# Patient Record
Sex: Female | Born: 2000 | Hispanic: Yes | Marital: Married | Smoking: Former smoker
Health system: Southern US, Community
[De-identification: ages and names within clinical notes are randomized; demographics above are authoritative.]

## PROBLEM LIST (undated history)

## (undated) DIAGNOSIS — Z789 Other specified health status: Secondary | ICD-10-CM

## (undated) HISTORY — DX: Other specified health status: Z78.9

## (undated) HISTORY — PX: WISDOM TOOTH EXTRACTION: SHX21

---
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
## 2021-02-09 ENCOUNTER — Ambulatory Visit: Payer: Medicaid Other | Admitting: Advanced Practice Midwife

## 2021-02-09 ENCOUNTER — Other Ambulatory Visit: Payer: Self-pay

## 2021-02-09 ENCOUNTER — Encounter: Payer: Self-pay | Admitting: Advanced Practice Midwife

## 2021-02-09 ENCOUNTER — Other Ambulatory Visit (HOSPITAL_COMMUNITY)
Admission: RE | Admit: 2021-02-09 | Discharge: 2021-02-09 | Disposition: A | Discharge status: Home / Self Care | Payer: Medicaid Other | Source: Ambulatory Visit | Attending: Advanced Practice Midwife | Admitting: Advanced Practice Midwife

## 2021-02-09 ENCOUNTER — Ambulatory Visit (INDEPENDENT_AMBULATORY_CARE_PROVIDER_SITE_OTHER): Payer: Medicaid Other | Admitting: Advanced Practice Midwife

## 2021-02-09 VITALS — BP 90/60 | Ht 62.0 in | Wt 108.0 lb

## 2021-02-09 DIAGNOSIS — Z113 Encounter for screening for infections with a predominantly sexual mode of transmission: Secondary | ICD-10-CM | POA: Diagnosis not present

## 2021-02-11 NOTE — Progress Notes (Signed)
   Patient ID: Secret Felicia [name], female   DOB: June 30, 2001, 20 y.o.   MRN: [id number]  Reason for Consult: STD testing    Subjective:  Date of Service: 02/09/2021  HPI:  Felicia [name] is a 20 y.o. female being seen for test of cure for Chlamydia treatment 2 months ago. She also mentions a new partner since treatment has Chlamydia. She does not use condoms. Reiterated advice to patient to use condoms to protect against STDs to protect her pelvic health. She denies any vaginal or urinary symptoms at this time.  Past Medical History:  Diagnosis Date  . No pertinent past medical history    History reviewed. No pertinent family history. Past Surgical History:  Procedure Laterality Date  . WISDOM TOOTH EXTRACTION      Short Social History:  Social History   Tobacco Use  . Smoking status: Current Some Day Smoker  . Smokeless tobacco: Never Used  Substance Use Topics  . Alcohol use: Not Currently    No Known Allergies  Current Outpatient Medications  Medication Sig Dispense Refill  . ibuprofen (ADVIL) 600 MG tablet Take 600 mg by mouth every 6 (six) hours.    Marland Kitchen oxyCODONE-acetaminophen (PERCOCET/ROXICET) 5-325 MG tablet Take 1 tablet by mouth every 4 (four) hours as needed.     No current facility-administered medications for this visit.    Review of Systems  Constitutional: Negative for chills and fever.  HENT: Negative for congestion, ear discharge, ear pain, hearing loss, sinus pain and sore throat.   Eyes: Negative for blurred vision and double vision.  Respiratory: Negative for cough, shortness of breath and wheezing.   Cardiovascular: Negative for chest pain, palpitations and leg swelling.  Gastrointestinal: Negative for abdominal pain, blood in stool, constipation, diarrhea, heartburn, melena, nausea and vomiting.  Genitourinary: Negative for dysuria, flank pain, frequency, hematuria and urgency.  Musculoskeletal: Negative for back pain, joint pain and myalgias.  Skin:  Negative for itching and rash.  Neurological: Negative for dizziness, tingling, tremors, sensory change, speech change, focal weakness, seizures, loss of consciousness, weakness and headaches.  Endo/Heme/Allergies: Negative for environmental allergies. Does not bruise/bleed easily.  Psychiatric/Behavioral: Negative for depression, hallucinations, memory loss, substance abuse and suicidal ideas. The patient is not nervous/anxious and does not have insomnia.        Objective:  Objective   Vitals:   02/09/21 1447  BP: 90/60  Weight: 108 lb (49 kg)  Height: 5\' 2"  (1.575 m)   Body mass index is 19.75 kg/m. Constitutional: Well nourished, well developed female in no acute distress.  HEENT: normal Skin: Warm and dry.  Cardiovascular: Regular rate and rhythm.   Extremity: no edema Respiratory: Clear to auscultation bilateral. Normal respiratory effort Neuro: DTRs 2+, Cranial nerves grossly intact Psych: Alert and Oriented x3. No memory deficits. Normal mood and affect.  MS: normal gait, normal bilateral lower extremity ROM/strength/stability.  Pelvic exam:  is not limited by body habitus EGBUS: within normal limits Vagina: within normal limits and with normal mucosa     Assessment/Plan:     20 y.o. G0 P0000 female STD TOC/testing  Aptima: vaginitis/STDs Follow up as needed after lab results   12 CNM [facility] 02/11/2021, 2:36 PM

## 2021-02-13 ENCOUNTER — Other Ambulatory Visit: Payer: Self-pay | Admitting: Advanced Practice Midwife

## 2021-02-13 DIAGNOSIS — A749 Chlamydial infection, unspecified: Secondary | ICD-10-CM

## 2021-02-13 DIAGNOSIS — B9689 Other specified bacterial agents as the cause of diseases classified elsewhere: Secondary | ICD-10-CM

## 2021-02-13 LAB — CERVICOVAGINAL ANCILLARY ONLY
Bacterial Vaginitis (gardnerella): POSITIVE — AB
Candida Glabrata: NEGATIVE
Candida Vaginitis: NEGATIVE
Chlamydia: POSITIVE — AB
Comment: NEGATIVE
Comment: NEGATIVE
Comment: NEGATIVE
Comment: NEGATIVE
Comment: NEGATIVE
Comment: NORMAL
Neisseria Gonorrhea: NEGATIVE
Trichomonas: NEGATIVE

## 2021-02-13 MED ORDER — AZITHROMYCIN 500 MG PO TABS: 1000.0000 mg | ORAL_TABLET | Freq: Once | ORAL | 1 refills | Status: AC

## 2021-02-13 MED ORDER — METRONIDAZOLE 500 MG PO TABS: 500.0000 mg | ORAL_TABLET | Freq: Two times a day (BID) | ORAL | 0 refills | Status: AC

## 2021-02-13 NOTE — Progress Notes (Signed)
Rx azithromycin to treat chlamydia Rx metronidazole to treat bv Spoke with patient and reviewed lab results/Rx's sent Communicable dx reporting done

## 2021-03-14 DIAGNOSIS — N946 Dysmenorrhea, unspecified: Secondary | ICD-10-CM | POA: Diagnosis not present

## 2021-03-14 DIAGNOSIS — Z7251 High risk heterosexual behavior: Secondary | ICD-10-CM | POA: Diagnosis not present

## 2021-03-14 DIAGNOSIS — Z113 Encounter for screening for infections with a predominantly sexual mode of transmission: Secondary | ICD-10-CM | POA: Diagnosis not present

## 2021-03-19 DIAGNOSIS — Z0000 Encounter for general adult medical examination without abnormal findings: Secondary | ICD-10-CM | POA: Diagnosis not present

## 2021-05-07 DIAGNOSIS — Z7251 High risk heterosexual behavior: Secondary | ICD-10-CM | POA: Diagnosis not present

## 2021-05-07 DIAGNOSIS — A749 Chlamydial infection, unspecified: Secondary | ICD-10-CM | POA: Diagnosis not present

## 2021-10-04 ENCOUNTER — Ambulatory Visit: Payer: Medicaid Other | Admitting: Licensed Practical Nurse

## 2021-10-12 ENCOUNTER — Ambulatory Visit (INDEPENDENT_AMBULATORY_CARE_PROVIDER_SITE_OTHER): Payer: Medicaid Other | Admitting: Licensed Practical Nurse

## 2021-10-12 ENCOUNTER — Other Ambulatory Visit: Payer: Self-pay

## 2021-10-12 ENCOUNTER — Encounter: Payer: Self-pay | Admitting: Licensed Practical Nurse

## 2021-10-12 VITALS — BP 98/62 | Ht 62.0 in | Wt 113.0 lb

## 2021-10-12 DIAGNOSIS — Z113 Encounter for screening for infections with a predominantly sexual mode of transmission: Secondary | ICD-10-CM | POA: Diagnosis not present

## 2021-10-12 DIAGNOSIS — N898 Other specified noninflammatory disorders of vagina: Secondary | ICD-10-CM | POA: Diagnosis not present

## 2021-10-12 DIAGNOSIS — N926 Irregular menstruation, unspecified: Secondary | ICD-10-CM

## 2021-10-12 LAB — POCT URINE PREGNANCY: Preg Test, Ur: NEGATIVE

## 2021-10-12 MED ORDER — AZITHROMYCIN 500 MG PO TABS: 1000.0000 mg | ORAL_TABLET | Freq: Once | ORAL | 0 refills | Status: AC

## 2021-10-12 NOTE — Progress Notes (Signed)
°  Gynecology STD Evaluation   Chief Complaint:  Chief Complaint  Patient presents with   Exposure to STD    STD testing + exposure to chlamydia. RM 4    History of Present Illness: Patient is a 21 y.o. G0P0000 presents for STD testing.  The patient has not noted intermenstrual spotting,  has not experienced postcoital bleeding, and does not report increased vaginal discharge.  But has experienced a foul odor. There is a history of prior sexually transmitted infection(s).     The patient is sexually active and reports unsure lifetime sexual partners . Has had 1 partner in the last 3 months, has had a new partner since last screening for HIV/RPR/HepB/C. She currently uses None for contraception. the patient rarely uses condoms to prevent the spread of sexually transmitted infections.  She patient has been previously transfused or tattooed.    Reports her partner was recently tested and treated for Chlamydia.  Pt asked about  having her tubes tied, discussed at her age and having not had children most providers would be resistant to this procedure.  Strongly encouraged considering LARC given her strong desire to not have children. Reviewed each method and pamphlets give.   LMP 09/07/2021/ Reports her cycles are "irregular" in that they may skip 1 to 2 months, and the flow is sometimes heavy but sometimes light.   PMHx: She  has a past medical history of No pertinent past medical history. Also,  has a past surgical history that includes Wisdom tooth extraction., family history is not on file.,  reports that she has been smoking. She has never used smokeless tobacco. She reports that she does not currently use alcohol. She reports current drug use. Drug: Marijuana.  She has a current medication list which includes the following prescription(s): azithromycin. Also, has No Known Allergies.  Review of Systems  Constitutional:  Negative for chills, fever and weight loss.  Gastrointestinal:  Negative  for abdominal pain.  Genitourinary:  Positive for dysuria. X 1 day [place] vaginal discharge  Objective: BP 98/62    Ht 5\' 2"  (1.575 m)    Wt 113 lb (51.3 kg)    LMP 09/07/2021    BMI 20.67 kg/m  Physical Exam Genitourinary:     Vulva normal.     Genitourinary Comments: Think white-green discharge present Cervix pink with ectrophy, no lesions   Pulmonary:     Effort: Pulmonary effort is normal.  Neurological:     Mental Status: She is alert.   Pregnancy test negative   Assessment: 21 y.o. G0P0000 STD screening and contraceptive counseling   Plan: Problem List Items Addressed This Visit   None    1) Contraception - Education given regarding options for contraception, including barrier methods, injectable contraception, IUD placement, oral contraceptives.   2) STI screening was offered and accepted  Script for Azithromycin sent to pharmacy on file   3) STD prevention, use and side effects of OCPs, and family planning choices  4) Will call and schedule apt once decided on Nexplanon or IUD   12, CNM  Carie Caddy, [facility]  10/12/21  11:38 AM

## 2021-10-16 ENCOUNTER — Other Ambulatory Visit: Payer: Self-pay | Admitting: Licensed Practical Nurse

## 2021-10-16 DIAGNOSIS — B9689 Other specified bacterial agents as the cause of diseases classified elsewhere: Secondary | ICD-10-CM

## 2021-10-16 DIAGNOSIS — N760 Acute vaginitis: Secondary | ICD-10-CM

## 2021-10-16 LAB — NUSWAB VAGINITIS (VG)
Atopobium vaginae: HIGH Score — AB
BVAB 2: HIGH Score — AB
Candida albicans, NAA: NEGATIVE
Candida glabrata, NAA: NEGATIVE
Trich vag by NAA: NEGATIVE

## 2021-10-16 MED ORDER — METRONIDAZOLE 500 MG PO TABS: 500.0000 mg | ORAL_TABLET | Freq: Two times a day (BID) | ORAL | 0 refills | Status: DC

## 2021-10-16 NOTE — Progress Notes (Signed)
TC to [place] Your swabs show that you have BV, reviewed nature of BV and recommend treatment, script has been sent your pharmacy. Also, gc/ct and were tested with the sample I sent to the lab, I have added those tests to current sample at [facility].  We should know results in 2 days. Lakera confirmed she has started treatment for presumed chlamydia, will go today or tomorrow to get Flagyl. She does not consume alcohol. Asissted in setting up mychart Will send message if testing is negative, otherwise I will call with abnormal results.  Carie Caddy, CNM  [facility], [facility]  10/16/21  3:05 PM

## 2021-10-17 LAB — HIV-1/HIV-2 QUALITATIVE RNA
Final Interpretation: NEGATIVE
HIV-1 RNA, Qualitative: NONREACTIVE
HIV-2 RNA, Qualitative: NONREACTIVE

## 2021-10-17 LAB — HIV 1/2 AB DIFFERENTIATION
HIV 1 Ab: NONREACTIVE
HIV 2 Ab: NONREACTIVE
NOTE (HIV CONF MULTIP: NEGATIVE

## 2021-10-17 LAB — HEP, RPR, HIV PANEL
HIV Screen 4th Generation wRfx: REACTIVE
Hepatitis B Surface Ag: NEGATIVE
RPR Ser Ql: NONREACTIVE

## 2021-10-17 LAB — HEPATITIS C ANTIBODY: Hep C Virus Ab: 0.1 s/co ratio (ref 0.0–0.9)

## 2021-10-18 ENCOUNTER — Telehealth: Payer: Self-pay | Admitting: Licensed Practical Nurse

## 2021-10-18 NOTE — Telephone Encounter (Signed)
TC Jaylynn called saying she may have been exposed to chlamydia again, she had IC with her partner today.  He is going to see his provider this week. Ashby did take her treatment on Monday, sounds like her partner has not yet been treated. (He told her he was exposed/has chlamydia). Reviewed her gc/ct test has not resulted yet, it was added on to the vaganitis swab collected last week.  Rec returning in 3-4 weeks for repeat screen/ to check for reinfection. Reviewed labs results from last visit, carefully explained HIV results, You are HIV negative.  Questions answered regarding contraception, she is leaning towards the Nexplanon, plans to come in 3-4 weeks for both gc/ct testing and contraception. She did ask if she can be on 2 forms of BC, reviewed you can only be on one hormone, but you can and should use condoms in addition to your other contraceptive choice. Now has access to [employer], CNM  [facility], [facility]  @TODAY @  4:28 PM

## 2021-10-18 NOTE — Telephone Encounter (Signed)
Pt called in and would like for you to give her a call when you get a chance.  It is in reference to her test results.

## 2021-10-19 ENCOUNTER — Encounter: Payer: Self-pay | Admitting: Licensed Practical Nurse

## 2021-10-19 LAB — GC/CHLAMYDIA PROBE AMP
Chlamydia trachomatis, NAA: POSITIVE — AB
Neisseria Gonorrhoeae by PCR: NEGATIVE

## 2021-10-19 LAB — SPECIMEN STATUS REPORT

## 2021-11-12 ENCOUNTER — Telehealth: Payer: Self-pay | Admitting: Obstetrics and Gynecology

## 2021-11-12 ENCOUNTER — Ambulatory Visit (INDEPENDENT_AMBULATORY_CARE_PROVIDER_SITE_OTHER): Payer: Medicaid Other | Admitting: Licensed Practical Nurse

## 2021-11-12 ENCOUNTER — Encounter: Payer: Self-pay | Admitting: Licensed Practical Nurse

## 2021-11-12 ENCOUNTER — Other Ambulatory Visit: Payer: Self-pay

## 2021-11-12 VITALS — BP 122/70 | Ht 62.0 in | Wt 118.0 lb

## 2021-11-12 DIAGNOSIS — Z3009 Encounter for other general counseling and advice on contraception: Secondary | ICD-10-CM | POA: Diagnosis not present

## 2021-11-12 DIAGNOSIS — Z113 Encounter for screening for infections with a predominantly sexual mode of transmission: Secondary | ICD-10-CM

## 2021-11-12 NOTE — Progress Notes (Signed)
°  Gynecology STD Evaluation   Chief Complaint:  Chief Complaint  Patient presents with   Exposure to STD    TOC from chlamydia    Contraception    History of Present Illness: Patient is a 21 y.o. G0P0000 presents for STD testing. Was treated for Chlamydia 10/18/21, was re-exposed during that time.  Reports her partner told her has been treated.  She continues to be sexually active with this partner.  Now uses condoms every time.   Desires Nexplanon for contraception, reviewed most common side effects and insertion process . Understands that her bleeding profile will most likely change.  Shown model of device.   PMHx: She  has a past medical history of No pertinent past medical history. Also,  has a past surgical history that includes Wisdom tooth extraction., family history is not on file.,  reports that she has been smoking. She has never used smokeless tobacco. She reports that she does not currently use alcohol. She reports current drug use. Drug: Marijuana.  She has a current medication list which includes the following prescription(s): metronidazole. Also, has No Known Allergies.  Review of Systems  Constitutional: Negative.   Respiratory: Negative.    Cardiovascular: Negative.   Gastrointestinal: Negative.   Genitourinary: Negative.   Endo/Heme/Allergies: Negative.    Objective: BP 122/70    Ht 5\' 2"  (1.575 m)    Wt 118 lb (53.5 kg)    LMP 11/05/2021 (Exact Date)    BMI 21.58 kg/m  Physical Exam Genitourinary:     Vulva normal.  Musculoskeletal:        General: Normal range of motion.  Neurological:     Mental Status: She is alert.  Skin:    General: Skin is warm.   Here for test of cure only, swan collected by this CNM as pt was already undressed,   Assessment: 21 y.o. G0P0000 No problem-specific Assessment & Plan notes found for this encounter.   Plan: Problem List Items Addressed This Visit   None Visit Diagnoses     Screen for sexually transmitted diseases     -  Primary   Relevant Orders   GC/Chlamydia Probe Amp        1) Contraception - Education given regarding options for contraception, including barrier methods.  Will return for Nexplanon    2) STI screening was offered swab only collected  12, CNM  Carie Caddy, [facility]  11/12/21  5:40 PM

## 2021-11-12 NOTE — Telephone Encounter (Signed)
Pt is scheduled for Nexplanon placement on 2/8 with [facility].

## 2021-11-13 DIAGNOSIS — Z113 Encounter for screening for infections with a predominantly sexual mode of transmission: Secondary | ICD-10-CM | POA: Diagnosis not present

## 2021-11-14 ENCOUNTER — Other Ambulatory Visit: Payer: Self-pay

## 2021-11-14 ENCOUNTER — Encounter: Payer: Self-pay | Admitting: Obstetrics and Gynecology

## 2021-11-14 ENCOUNTER — Ambulatory Visit (INDEPENDENT_AMBULATORY_CARE_PROVIDER_SITE_OTHER): Payer: Medicaid Other | Admitting: Obstetrics and Gynecology

## 2021-11-14 VITALS — BP 90/60 | Ht 62.0 in | Wt 113.0 lb

## 2021-11-14 DIAGNOSIS — Z30017 Encounter for initial prescription of implantable subdermal contraceptive: Secondary | ICD-10-CM

## 2021-11-14 LAB — POCT URINE PREGNANCY: Preg Test, Ur: NEGATIVE

## 2021-11-14 MED ORDER — ETONOGESTREL 68 MG ~~LOC~~ IMPL: 1.0000 | DRUG_IMPLANT | Freq: Once | SUBCUTANEOUS | 0 refills | Status: DC

## 2021-11-14 NOTE — Patient Instructions (Signed)
I value your feedback and you entrusting us with your care. If you get a [facility] patient survey, I would appreciate you taking the time to let us know about your experience today. Thank you!  Remove the dressing in 24 hours,  keep the incision area dry for 24 hours and remove the Steristrip in 2-3  days.  Notify us if any signs of tenderness, redness, pain, or fevers develop.   

## 2021-11-14 NOTE — Telephone Encounter (Signed)
Noted. Nexplanon reserved for this patient. 

## 2021-11-14 NOTE — Progress Notes (Signed)
° °  Chief Complaint  Patient presents with   Contraception    Nexplanon insertion     HPI:  Felicia [name] is a 21 y.o. G0P0000 here for Nexplanon insertion for [facility], changing from condoms. Awaiting chlamydia TOC form 11/12/21 with Carie Caddy.    BP 90/60    Ht 5\' 2"  (1.575 m)    Wt 113 lb (51.3 kg)    LMP 11/05/2021 (Exact Date)    BMI 20.67 kg/m    Nexplanon Insertion  Patient given informed consent, signed copy in the chart, time out was performed. Pregnancy test was neg Appropriate time out taken.  Patient's LEFT arm was prepped and draped in the usual sterile fashion. The ruler used to measure and mark insertion area.  Pt was prepped with betadine swab and then injected with 1.0 cc of 2% lidocaine with epinephrine. Nexplanon removed form packaging,  Device confirmed in needle, then inserted full length of needle and withdrawn per handbook instructions.  Pt insertion site covered with steri-strip and a bandage.   Minimal blood loss.  Pt tolerated the procedure welL.  Results for orders placed or performed in visit on 11/14/21 (from the past 24 hour(s))  POCT urine pregnancy     Status: Normal   Collection Time: 11/14/21 10:13 AM  Result Value Ref Range   Preg Test, Ur Negative Negative    Assessment: Nexplanon insertion - Plan: POCT urine pregnancy, etonogestrel (NEXPLANON) 68 MG IMPL implant   Meds ordered this encounter  Medications   etonogestrel (NEXPLANON) 68 MG IMPL implant    Sig: 1 each (68 mg total) by Subdermal route once for 1 dose.    Dispense:  1 each    Refill:  0    Order Specific Question:   Supervising Provider    Answer:   01/12/22 Nadara Mustard     Plan:   She was told to remove the dressing in 12-24 hours, to keep the incision area dry for 24 hours and to remove the Steristrip in 2-3  days.  Notify 09-21-1991 if any signs of tenderness, redness, pain, or fevers develop.   Kevron Patella B. Roi Jafari, PA-C 11/14/2021 10:23 AM

## 2021-11-15 LAB — GC/CHLAMYDIA PROBE AMP
Chlamydia trachomatis, NAA: NEGATIVE
Neisseria Gonorrhoeae by PCR: NEGATIVE

## 2021-12-17 ENCOUNTER — Other Ambulatory Visit: Payer: Self-pay

## 2021-12-17 ENCOUNTER — Encounter: Payer: Self-pay | Admitting: Licensed Practical Nurse

## 2021-12-17 ENCOUNTER — Ambulatory Visit (INDEPENDENT_AMBULATORY_CARE_PROVIDER_SITE_OTHER): Payer: Medicaid Other | Admitting: Licensed Practical Nurse

## 2021-12-17 VITALS — BP 118/70 | Ht 62.0 in | Wt 117.0 lb

## 2021-12-17 DIAGNOSIS — Z3046 Encounter for surveillance of implantable subdermal contraceptive: Secondary | ICD-10-CM | POA: Diagnosis not present

## 2021-12-17 DIAGNOSIS — N939 Abnormal uterine and vaginal bleeding, unspecified: Secondary | ICD-10-CM | POA: Diagnosis not present

## 2021-12-17 DIAGNOSIS — N926 Irregular menstruation, unspecified: Secondary | ICD-10-CM | POA: Diagnosis not present

## 2021-12-17 LAB — POCT URINE PREGNANCY: Preg Test, Ur: NEGATIVE

## 2021-12-17 NOTE — Progress Notes (Signed)
S) Here with boyfriend Felicia [name],  for possible SAB.  ?Had Nexplanon insertion on 2/8, had negative home UPT and negative in office UPT on day of procedure. She abstained from IC for 2 weeks, and then used condoms with IC for one week. On the night of March 7 she had bad stomach cramps, the next morning she had some cramping and passed blood clots and then later passed something that looked like tissue, then she had a little more bleeding and it stopped. She wonders if she had a miscarriage.  Her home UPT were all negative. She has had breast tenderness, but this in not new for her. She cannot say when in her cycle she has the tenderness.  ? ?0) BP 118/70   Ht 5\' 2"  (1.575 m)   Wt 117 lb (53.1 kg)   BMI 21.40 kg/m?  ?GEN: NAD ?Pregnancy test negative  ?Nexplanon palpable in left arm  ? ? ?A) Abnormal bleeding in Menstrual  secondary to Nexplanon ? ?P) Briefly reviewed menstrual cycle and signs of ovulation. Reviewed it is unlikely that she was pregnant as she was not having IC during the time that she could have ovulated (assuming a 28 day cycle). Also, the timing between not using condoms and the bleeding is too short for pregnancy to have happened.  Reminded Felicia [name] that  her cycles may become irregular, the bleeding pattern may change,  or her cycle may  stop for as long as she has Nexplanon in place. Breast tenderness is a common side effect of most birth control.  Encouraged Felicia [name] to see how the next 6 months go and how her body adapts to the device.  Encouraged regular condom use, as condom can help prevent transmission of sexually transmitted infections.  ? ?Schedule annual exam at your convenience  ? ?Felicia [name], CNM  ?Felicia [name], [place]  ?12/17/21  ?10:05 AM  ? ?

## 2021-12-21 ENCOUNTER — Ambulatory Visit: Payer: Medicaid Other | Admitting: Licensed Practical Nurse

## 2022-01-01 ENCOUNTER — Other Ambulatory Visit: Payer: Self-pay

## 2022-01-01 ENCOUNTER — Encounter: Payer: Self-pay | Admitting: Licensed Practical Nurse

## 2022-01-01 ENCOUNTER — Ambulatory Visit (INDEPENDENT_AMBULATORY_CARE_PROVIDER_SITE_OTHER): Payer: Medicaid Other | Admitting: Licensed Practical Nurse

## 2022-01-01 VITALS — BP 90/50 | Ht 62.0 in | Wt 117.0 lb

## 2022-01-01 DIAGNOSIS — Z01419 Encounter for gynecological examination (general) (routine) without abnormal findings: Secondary | ICD-10-CM | POA: Diagnosis not present

## 2022-01-01 NOTE — Progress Notes (Signed)
? ? ? ?Gynecology Annual Exam  ?PCP: Pcp, No ? ?Chief Complaint:  ?Chief Complaint  ?Patient presents with  ? Gynecologic Exam  ?  No concerns  ? ? ?History of Present Illness: Patient is a 21 y.o. G0P0000 presents for annual exam. The patient has no complaints today.  ? ?LMP: Patient's last menstrual period was 12/01/2021 (exact date). ?Had Nexplanon placed February 2.  Has been bleeding since Feb 25, started off heavy  is light in amount with dark blood now.  ?Postcoital Bleeding: no ?Dysmenorrhea: no ? ?The patient is sexually active with 1 female, they have bene together about 1 year, he may have had another partner at some point-Felicia [name] has been treated for chlamydia in their exclusive relationship. The do not use condoms.  She currently uses Nexplanon for contraception. She denies dyspareunia.  The patient does not perform self breast exams.  There is no notable family history of breast or ovarian cancer in her family. ? ?The patient wears seatbelts: yes.  The patient has regular exercise:  likes to go for walks .   ? ?The patient denies current symptoms of depression.   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 Trying to save up money so  that she can go to school, unsure of what she wants to do but considering cosmetology.  ?Feels safe with her partner ? ?Dental exam: up to date ?Eye exam" scheduled for next week  ? ?Review of Systems: Review of Systems  ?Constitutional: Negative.   ?Respiratory: Negative.    ?Cardiovascular: Negative.   ?Gastrointestinal: Negative.   ?Genitourinary: Negative.   ?Musculoskeletal: Negative.   ?Neurological: Negative.   ?Endo/Heme/Allergies: Negative.   ?Psychiatric/Behavioral: Negative.    ? ?Past Medical History:  ?Patient Active Problem List  ? Diagnosis Date Noted  ? Constipation 02/28/2020  ? Ingrown toenail 02/28/2020  ? Nocturnal enuresis 02/28/2020  ? ? ?Past Surgical History:  ?Past Surgical History:   ?Procedure Laterality Date  ? WISDOM TOOTH EXTRACTION    ? ? ?Gynecologic History:  ?Patient's last menstrual period was 12/01/2021 (exact date). ?Contraception: Nexplanon ?Last Pap: Results were: never collected d/t age  ? ?Obstetric History: G0P0000 ? ?Family History:  ?No family history on file. ? ?Social History:  ?Social History  ? ?Socioeconomic History  ? Marital status: Single  ?  Spouse name: Not on file  ? Number of children: Not on file  ? Years of education: Not on file  ? Highest education level: Not on file  ?Occupational History  ? Not on file  ?Tobacco Use  ? Smoking status: Some Days  ? Smokeless tobacco: Never  ?Vaping Use  ? Vaping Use: Every day  ?Substance and Sexual Activity  ? Alcohol use: Not Currently  ? Drug use: Yes  ?  Types: Marijuana  ? Sexual activity: Yes  ?  Birth control/protection: Implant  ?Other Topics Concern  ? Not on file  ?Social History Narrative  ? Not on file  ? ?Social Determinants of Health  ? ?Financial Resource Strain: Not on file  ?Food Insecurity: Not on file  ?Transportation Needs: Not on file  ?Physical Activity: Not on file  ?Stress: Not on file  ?Social Connections: Not on file  ?Intimate Partner Violence: Not on file  ? ? ?Allergies:  ?No Known Allergies ? ?Medications: ?Prior to Admission medications   ?Medication Sig Start Date End Date Taking? Authorizing Provider  ?etonogestrel (NEXPLANON) 68 MG IMPL implant 1  each (68 mg total) by Subdermal route once for 1 dose. 11/14/21 11/14/21  Copland, Ilona Sorrel, PA-C  ? ? ?Physical Exam ?Vitals: Blood pressure (!) 90/50, height 5\' 2"  (1.575 m), weight 117 lb (53.1 kg), last menstrual period 12/01/2021. ? ?General: NAD ?HEENT: normocephalic, anicteric ?Thyroid: no enlargement, no palpable nodules ?Pulmonary: No increased work of breathing, CTAB ?Cardiovascular: RRR, distal pulses 2+ ?Breast: declines exam  ?Abdomen: NABS, soft, non-tender, non-distended.  Umbilicus without lesions.  No hepatomegaly, splenomegaly or masses  palpable. No evidence of hernia  ?Genitourinary: declines exam  ? Rectal: deferred ? Lymphatic: no evidence of inguinal lymphadenopathy ?Extremities: no edema, erythema, or tenderness ?Neurologic: Grossly intact ?Psychiatric: mood appropriate, affect full ? ?Assessment: 21 y.o. G0P0000 routine annual exam ? ?Plan: ?Problem List Items Addressed This Visit   ?None ? ? ?1) 4) Gardasil Series not reviewed, attempted to call pt mail box full, mychart message sent.  ? ?2) STI screening  wasoffered and declined was fully screened January 2023, gc/ct screening in February 2023  ? ?3)  ASCCP guidelines and rational discussed.  Patient opts for  start at age 84  screening interval ? ?4) Contraception - the patient is currently using  Nexplanon.  She is happy with her current form of contraception and plans to continue We discussed safe sex practices to reduce her furture risk of STI's.   ? ?5) No follow-ups on file. ? ? ?36, CNM  ?[facility], [facility] ?01/01/2022, 2:12 PM ?  ?

## 2022-01-07 DIAGNOSIS — H5213 Myopia, bilateral: Secondary | ICD-10-CM | POA: Diagnosis not present

## 2022-01-18 NOTE — Telephone Encounter (Signed)
Nexplanon rcvd/charged 11/14/21 ?

## 2022-04-05 ENCOUNTER — Other Ambulatory Visit: Payer: Self-pay

## 2022-04-05 ENCOUNTER — Emergency Department: Payer: No Typology Code available for payment source

## 2022-04-05 ENCOUNTER — Emergency Department
Admission: EM | Admit: 2022-04-05 | Discharge: 2022-04-05 | Disposition: A | Discharge status: Home / Self Care | Payer: No Typology Code available for payment source | Attending: Emergency Medicine | Admitting: Emergency Medicine

## 2022-04-05 DIAGNOSIS — S0101XA Laceration without foreign body of scalp, initial encounter: Secondary | ICD-10-CM | POA: Diagnosis not present

## 2022-04-05 DIAGNOSIS — Y92410 Unspecified street and highway as the place of occurrence of the external cause: Secondary | ICD-10-CM | POA: Insufficient documentation

## 2022-04-05 DIAGNOSIS — R52 Pain, unspecified: Secondary | ICD-10-CM | POA: Diagnosis not present

## 2022-04-05 DIAGNOSIS — G4489 Other headache syndrome: Secondary | ICD-10-CM | POA: Diagnosis not present

## 2022-04-05 DIAGNOSIS — R000 Tachycardia, unspecified: Secondary | ICD-10-CM | POA: Diagnosis not present

## 2022-04-05 DIAGNOSIS — S0990XA Unspecified injury of head, initial encounter: Secondary | ICD-10-CM | POA: Diagnosis not present

## 2022-04-05 DIAGNOSIS — R58 Hemorrhage, not elsewhere classified: Secondary | ICD-10-CM | POA: Diagnosis not present

## 2022-04-05 MED ORDER — ACETAMINOPHEN 325 MG PO TABS
650.0000 mg | ORAL_TABLET | Freq: Once | ORAL | Status: DC
  Filled 2022-04-05: qty 2

## 2022-04-05 MED ORDER — LIDOCAINE HCL (PF) 1 % IJ SOLN
5.0000 mL | Freq: Once | INTRAMUSCULAR | Status: AC
  Administered 2022-04-05: 5 mL via INTRADERMAL
  Filled 2022-04-05: qty 5
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
## 2022-04-05 NOTE — Discharge Instructions (Signed)
Your head CT was normal.  You may continue to take Tylenol/ibuprofen per package instructions to help with your symptoms.  Your laceration was repaired with staples. Keep the area clean and dry.  Wash multiple times per day with soap and water.  Do not go into the ocean or swimming pool.  Return to the emergency department or your primary care physician's office in 7 days for staple removal.  Return to the emergency department for:  -- Fever > 100.74F -- Increase pain in the wound -- Increase redness and swelling -- Pus coming from the wound -- Wound bleeds more than a small amount or it does not stop -- Wound edges come apart -- Severe pain -- Weakness or numbness in the affected area Vomiting, worsening headache, change in your vision, unilateral weakness or numbness or any other new or worsening symptoms. It was a pleasure caring for you.

## 2022-04-15 ENCOUNTER — Other Ambulatory Visit: Payer: Self-pay

## 2022-04-15 ENCOUNTER — Emergency Department
Admission: EM | Admit: 2022-04-15 | Discharge: 2022-04-15 | Disposition: A | Discharge status: Home / Self Care | Payer: Medicaid Other | Attending: Emergency Medicine | Admitting: Emergency Medicine

## 2022-04-15 DIAGNOSIS — X58XXXD Exposure to other specified factors, subsequent encounter: Secondary | ICD-10-CM | POA: Diagnosis not present

## 2022-04-15 DIAGNOSIS — Z4802 Encounter for removal of sutures: Secondary | ICD-10-CM

## 2022-04-15 DIAGNOSIS — S0101XD Laceration without foreign body of scalp, subsequent encounter: Secondary | ICD-10-CM | POA: Insufficient documentation
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
## 2022-04-15 NOTE — ED Triage Notes (Signed)
Pt is here to have staples removed,

## 2022-05-15 ENCOUNTER — Encounter: Payer: Self-pay | Admitting: Obstetrics & Gynecology

## 2022-05-15 ENCOUNTER — Ambulatory Visit (INDEPENDENT_AMBULATORY_CARE_PROVIDER_SITE_OTHER): Payer: Medicaid Other | Admitting: Obstetrics & Gynecology

## 2022-05-15 DIAGNOSIS — Z3046 Encounter for surveillance of implantable subdermal contraceptive: Secondary | ICD-10-CM

## 2022-05-15 DIAGNOSIS — Z309 Encounter for contraceptive management, unspecified: Secondary | ICD-10-CM | POA: Insufficient documentation

## 2022-05-15 NOTE — Progress Notes (Unsigned)
Pt comes in today for Nexplanon removal.  She has decided  to use condoms as her form of contraception at this time. Nexplanon was placed in Feb. 2023.  She wants the device removed because she has been having daily bleeding for the past 6 mos.  The skin was cleansed on the upper left arm with betadine over the site of the Nexplanon.   The remval site was infiltrated with 1% Lidocaine 2cc.  #11 blade was used to make a 1cm incision, and dissect out the Nexplanon.    Hemostasis was performed with gauze, steristrips, and a band aid , as well as a kling wrap.  Pt was instructed in the routine care of her wound.  Questions were answered.    Pt will RTC prn.

## 2022-05-16 ENCOUNTER — Encounter: Payer: Self-pay | Admitting: Obstetrics & Gynecology

## 2022-05-27 ENCOUNTER — Telehealth: Payer: Self-pay | Admitting: Licensed Practical Nurse

## 2022-05-27 NOTE — Telephone Encounter (Signed)
Pt has called in wanting to schedule a pap smear.  She had her annual with you on 01/01/2022.  Can this pt just schedule a pap smear?  She is also wanting sti testing.

## 2022-05-28 NOTE — Telephone Encounter (Signed)
Ok. I will let the pt know.

## 2022-05-30 ENCOUNTER — Encounter: Payer: Self-pay | Admitting: Obstetrics

## 2022-05-30 ENCOUNTER — Other Ambulatory Visit (HOSPITAL_COMMUNITY)
Admission: RE | Admit: 2022-05-30 | Discharge: 2022-05-30 | Disposition: A | Discharge status: Home / Self Care | Payer: Medicaid Other | Source: Ambulatory Visit | Attending: Obstetrics | Admitting: Obstetrics

## 2022-05-30 ENCOUNTER — Ambulatory Visit (INDEPENDENT_AMBULATORY_CARE_PROVIDER_SITE_OTHER): Payer: Medicaid Other | Admitting: Obstetrics

## 2022-05-30 VITALS — BP 118/70 | Ht 62.0 in | Wt 108.0 lb

## 2022-05-30 DIAGNOSIS — Z113 Encounter for screening for infections with a predominantly sexual mode of transmission: Secondary | ICD-10-CM

## 2022-05-30 DIAGNOSIS — Z30018 Encounter for initial prescription of other contraceptives: Secondary | ICD-10-CM | POA: Diagnosis not present

## 2022-05-30 DIAGNOSIS — N898 Other specified noninflammatory disorders of vagina: Secondary | ICD-10-CM

## 2022-05-30 NOTE — Progress Notes (Signed)
Patient is a 21 y.o. G0P0000 presents for STD testing.  She has noticed some increased vaginal discharge. Recently had a Nexplanon removed, and has not chosen another method of contraception. She is not interested in getting pregnant at this time. The patient has not noted intermenstrual spotting,  has not experienced postcoital bleeding, and does report increased vaginal discharge.  There is not a history of prior sexually transmitted infection(s).     The patient is sexually active and reports 2 lifetime sexual partners . She currently uses Condoms for contraception. She sometimes uses condoms or barrier methods to prevent the spread of sexually transmitted infections.  Nareh is interested in discussing contraceptive methods.She had continuous vaginal spotting for 6 months with the Nexplanon,so had it removed a few weeks ago.The patient has not been previously transfused or tattooed.    She is here with her boyfriend.   O: BP 118/70   Ht 5\' 2"  (1.575 m)   Wt 108 lb (49 kg)   BMI 19.75 kg/m  Review of Systems  Constitutional: Negative.   HENT: Negative.    Eyes: Negative.   Respiratory: Negative.    Cardiovascular: Negative.   Gastrointestinal: Negative.   Genitourinary: Negative.   Musculoskeletal: Negative.   Skin: Negative.   All other systems reviewed and are negative.  Physical Exam Vitals and nursing note reviewed.  Constitutional:      Appearance: Normal appearance. She is normal weight.  HENT:     Head: Normocephalic and atraumatic.  Cardiovascular:     Rate and Rhythm: Normal rate and regular rhythm.  Pulmonary:     Effort: Pulmonary effort is normal.     Breath sounds: Normal breath sounds.  Abdominal:     General: Abdomen is flat.     Palpations: Abdomen is soft.  Genitourinary:    General: Normal vulva.     Rectum: Normal.     Comments: No extranl lesions. Pubic hair is clipped short. Normal vaginal mucoas, no obvous discharge. Nulliparous cervix. Aptima swab  retrieved. Vaginal walls are a tad "ruddy" in appearance. Musculoskeletal:     Cervical back: Normal range of motion and neck supple.  Skin:    General: Skin is warm and dry.  Neurological:     General: No focal deficit present.     Mental Status: She is alert and oriented to person, place, and time.  Psychiatric:        Mood and Affect: Mood normal.        Behavior: Behavior normal.   A: vaginal discharge STI screening Contraceptive education and options  P:Aptima swab sent for testing Will let he know results. We spent extras time reviewing birth control methods. The routine use of  condoms is advised today. She is somewhat interested in Depo , but opted not to start today. Encouraged her to reach out and make an appointment , or request a prescriptive method soon. Aslo advised to start on a daily multivitamin.  , CNM  05/30/2022 5:47 PM

## 2022-05-30 NOTE — Telephone Encounter (Signed)
Pt is scheduled with [facility] on 8/24 for STD testing.

## 2022-06-03 ENCOUNTER — Other Ambulatory Visit: Payer: Self-pay

## 2022-06-03 DIAGNOSIS — B379 Candidiasis, unspecified: Secondary | ICD-10-CM

## 2022-06-03 LAB — CERVICOVAGINAL ANCILLARY ONLY
Bacterial Vaginitis (gardnerella): NEGATIVE
Candida Glabrata: NEGATIVE
Candida Vaginitis: POSITIVE — AB
Chlamydia: NEGATIVE
Comment: NEGATIVE
Comment: NEGATIVE
Comment: NEGATIVE
Comment: NEGATIVE
Comment: NEGATIVE
Comment: NORMAL
Neisseria Gonorrhea: NEGATIVE
Trichomonas: NEGATIVE

## 2022-06-03 MED ORDER — FLUCONAZOLE 150 MG PO TABS: 150.0000 mg | ORAL_TABLET | Freq: Once | ORAL | 0 refills | Status: AC

## 2022-06-20 ENCOUNTER — Ambulatory Visit (INDEPENDENT_AMBULATORY_CARE_PROVIDER_SITE_OTHER): Payer: Medicaid Other | Admitting: Licensed Practical Nurse

## 2022-06-20 ENCOUNTER — Encounter: Payer: Self-pay | Admitting: Licensed Practical Nurse

## 2022-06-20 VITALS — BP 100/60 | Ht 62.0 in | Wt 110.0 lb

## 2022-06-20 DIAGNOSIS — L905 Scar conditions and fibrosis of skin: Secondary | ICD-10-CM

## 2022-06-20 MED ORDER — TRIAMCINOLONE ACETONIDE 0.025 % EX OINT: 1.0000 | TOPICAL_OINTMENT | Freq: Two times a day (BID) | CUTANEOUS | 0 refills | Status: DC

## 2022-06-20 MED ORDER — LIDOCAINE HCL URETHRAL/MUCOSAL 2 % EX GEL: 1.0000 | CUTANEOUS | 0 refills | Status: DC | PRN

## 2022-06-20 NOTE — Progress Notes (Signed)
SUBJECTIVE: Here with partner  Here for evaluation of her Nexplanon site. Had Nexplanon removed on 8/9.  Since then she has pain at that site, the pain will also radiate around the site. Occasionally she feels some numbness along her arm below the site. She has pain when she rotates her arms in circles. The area is tender to touch.  Denies any fevers/chills, or pus like drainage. Pt is currently sexually active, they use condoms some of the time, and sometimes "pull out".  She does not desire a pregnancy.  She is not sure what she  would like to do for birth control.  The Nexplanon caused her to bleed.  She would like something that would take her periods away. She smokes "a little" denies CHTN. She has not had a cycle since the implant removal. States she does not get monthly periods, she has taken UPT and they have all been negative.   OBJECTIVE: BP 100/60   Ht 5\' 2"  (1.575 m)   Wt 110 lb (49.9 kg)   BMI 20.12 kg/m  Gen: NAD Left arm: 1 mm raised area, tender to touch, no redness or swelling, no exudate. Skin surrounding site intact no redness.   ASSESSMENT: Scar formation possible keloid  PLAN: Reviewed assessment with Dr .  Script for Kenalog ointment and Lidocaine sent to pharmacy  Pt called, reviewed plan, if no improvement pt may require injection of Kenalog.  Contraception: pt aware of all options, may consider continuous use OCP's, will call or send mychart message if she desires to start OCP's   Valentino Saxon, CNM  Carie Caddy, [facility]  06/21/22  5:15 PM

## 2023-02-20 ENCOUNTER — Ambulatory Visit: Payer: Medicaid Other | Admitting: Obstetrics and Gynecology

## 2023-03-31 ENCOUNTER — Encounter: Payer: Self-pay | Admitting: Advanced Practice Midwife

## 2023-03-31 ENCOUNTER — Other Ambulatory Visit (HOSPITAL_COMMUNITY)
Admission: RE | Admit: 2023-03-31 | Discharge: 2023-03-31 | Disposition: A | Discharge status: Home / Self Care | Payer: Medicaid Other | Source: Ambulatory Visit | Attending: Obstetrics and Gynecology | Admitting: Obstetrics and Gynecology

## 2023-03-31 ENCOUNTER — Ambulatory Visit: Payer: Medicaid Other | Admitting: Obstetrics and Gynecology

## 2023-03-31 ENCOUNTER — Other Ambulatory Visit: Payer: Self-pay | Admitting: Advanced Practice Midwife

## 2023-03-31 ENCOUNTER — Ambulatory Visit (INDEPENDENT_AMBULATORY_CARE_PROVIDER_SITE_OTHER): Payer: Medicaid Other | Admitting: Advanced Practice Midwife

## 2023-03-31 VITALS — BP 100/67 | HR 85 | Ht 62.0 in | Wt 106.0 lb

## 2023-03-31 DIAGNOSIS — Z113 Encounter for screening for infections with a predominantly sexual mode of transmission: Secondary | ICD-10-CM

## 2023-03-31 DIAGNOSIS — Z124 Encounter for screening for malignant neoplasm of cervix: Secondary | ICD-10-CM | POA: Diagnosis present

## 2023-03-31 DIAGNOSIS — Z13220 Encounter for screening for lipoid disorders: Secondary | ICD-10-CM

## 2023-03-31 DIAGNOSIS — N926 Irregular menstruation, unspecified: Secondary | ICD-10-CM

## 2023-03-31 DIAGNOSIS — Z131 Encounter for screening for diabetes mellitus: Secondary | ICD-10-CM | POA: Diagnosis not present

## 2023-03-31 DIAGNOSIS — Z01419 Encounter for gynecological examination (general) (routine) without abnormal findings: Secondary | ICD-10-CM | POA: Diagnosis not present

## 2023-03-31 DIAGNOSIS — Z1159 Encounter for screening for other viral diseases: Secondary | ICD-10-CM

## 2023-03-31 DIAGNOSIS — K1370 Unspecified lesions of oral mucosa: Secondary | ICD-10-CM

## 2023-03-31 DIAGNOSIS — Z3202 Encounter for pregnancy test, result negative: Secondary | ICD-10-CM | POA: Diagnosis not present

## 2023-03-31 LAB — POCT URINE PREGNANCY: Preg Test, Ur: NEGATIVE

## 2023-03-31 NOTE — Progress Notes (Signed)
[facility]   Gynecology Annual Exam  PCP: Pcp, No  Chief Complaint:  Chief Complaint  Patient presents with   Annual Exam    History of Present Illness: Patient is a 22 y.o. G0P0000 presents for annual exam. Her last intercourse -no protection- was in May and since then has been abstinent. She did not have a period as usual in June and admits situational stress in the past 2 months. The patient has complaint today of bump inside her mouth that she has noticed for about 1 month. There has been some pain associated with the bump. She denies history of HSV or knowledge of partner HSV.    LMP: Patient's last menstrual period was 02/12/2023 (exact date). Menarche:16 Average Interval: regular, 28 days Duration of flow: 7 days Heavy Menses: yes Clots: yes Intermenstrual Bleeding: no Postcoital Bleeding: no Dysmenorrhea: yes  The patient is not currently sexually active. She currently uses abstinence for contraception. She denies dyspareunia.  The patient  does occasionally  perform self breast exams.  There is no notable family history of breast or ovarian cancer in her family.  The patient wears seatbelts: yes.  The patient has regular exercise:  she has some limited physical activity- some stretching and some walking at her job at [employer] .    The patient denies current symptoms of depression.    Review of Systems: Review of Systems  Constitutional:  Negative for chills and fever.  HENT:  Negative for congestion, ear discharge, ear pain, hearing loss, sinus pain and sore throat.        Positive for bump inside lower lip  Eyes:  Negative for blurred vision and double vision.  Respiratory:  Negative for cough, shortness of breath and wheezing.   Cardiovascular:  Negative for chest pain, palpitations and leg swelling.  Gastrointestinal:  Negative for abdominal pain, blood in stool, constipation, diarrhea, heartburn, melena, nausea and vomiting.  Genitourinary:  Negative for  dysuria, flank pain, frequency, hematuria and urgency.  Musculoskeletal:  Negative for back pain, joint pain and myalgias.  Skin:  Negative for itching and rash.  Neurological:  Positive for dizziness, tingling and headaches. Negative for tremors, sensory change, speech change, focal weakness, seizures, loss of consciousness and weakness.  Endo/Heme/Allergies:  Negative for environmental allergies. Bruises/bleeds easily.  Psychiatric/Behavioral:  Negative for depression, hallucinations, memory loss, substance abuse and suicidal ideas. The patient is not nervous/anxious and does not have insomnia.     Past Medical History:  Patient Active Problem List   Diagnosis Date Noted   Contraception management 05/15/2022   Encounter for removal of subdermal contraceptive implant 05/15/2022    Removed on 05-15-22    Constipation 02/28/2020   Ingrown toenail 02/28/2020   Nocturnal enuresis 02/28/2020    Past Surgical History:  Past Surgical History:  Procedure Laterality Date   WISDOM TOOTH EXTRACTION      Gynecologic History:  Patient's last menstrual period was 02/12/2023 (exact date). Contraception: abstinence Last Pap: no PAP history  Obstetric History: G0P0000  Family History:  History reviewed. No pertinent family history.  Social History:  Social History   Socioeconomic History   Marital status: Single    Spouse name: Not on file   Number of children: Not on file   Years of education: Not on file   Highest education level: Not on file  Occupational History   Not on file  Tobacco Use   Smoking status: Some Days   Smokeless tobacco: Never  Vaping Use  Vaping Use: Every day  Substance and Sexual Activity   Alcohol use: Not Currently   Drug use: Yes    Types: Marijuana   Sexual activity: Yes  Other Topics Concern   Not on file  Social History Narrative   Not on file   Social Determinants of Health   Financial Resource Strain: Not on file  Food Insecurity: Not on  file  Transportation Needs: Not on file  Physical Activity: Not on file  Stress: Not on file  Social Connections: Not on file  Intimate Partner Violence: Not on file    Allergies:  No Known Allergies  Medications: Prior to Admission medications   Not on File    Physical Exam Vitals: Blood pressure 100/67, pulse 85, height 5\' 2"  (1.575 m), weight 106 lb (48.1 kg), last menstrual period 02/12/2023.  General: NAD HEENT: normocephalic, blister inside right lower lip Thyroid: no enlargement, no palpable nodules Pulmonary: No increased work of breathing, CTAB Cardiovascular: RRR, distal pulses 2+ Breast: Breast symmetrical, no tenderness, no palpable nodules or masses, no skin or nipple retraction present, no nipple discharge.  No axillary or supraclavicular lymphadenopathy. Abdomen: NABS, soft, non-tender, non-distended.  Umbilicus without lesions.  No hepatomegaly, splenomegaly or masses palpable. No evidence of hernia  Genitourinary:  External: Normal external female genitalia.  Normal urethral meatus, normal Bartholin's and Skene's glands.    Vagina: Normal vaginal mucosa, no evidence of prolapse.    Cervix: Grossly normal in appearance, no bleeding  Uterus: Non-enlarged, mobile, normal contour.  No CMT  Adnexa: ovaries non-enlarged, no adnexal masses  Rectal: deferred  Lymphatic: no evidence of inguinal lymphadenopathy Extremities: no edema, erythema, or tenderness Neurologic: Grossly intact Psychiatric: mood appropriate, affect full  UPT today is negative  Assessment: 22 y.o. G0P0000 routine annual exam  Plan: Problem List Items Addressed This Visit   None Visit Diagnoses     Well woman exam with routine gynecological exam    -  Primary   Relevant Orders   Hepatitis B surface antibody,qualitative   Hepatitis C antibody   HIV Antibody (routine testing w rflx)   RPR Qual   Cytology - PAP   CBC with Differential/Platelet   Comprehensive metabolic panel   Lipid  Panel With LDL/HDL Ratio   Hgb A1c w/o eAG   TSH   Herpes simplex virus culture   Screen for sexually transmitted diseases       Relevant Orders   Hepatitis B surface antibody,qualitative   Hepatitis C antibody   HIV Antibody (routine testing w rflx)   RPR Qual   Cytology - PAP   Herpes simplex virus culture   Need for hepatitis B screening test       Relevant Orders   Hepatitis B surface antibody,qualitative   Need for hepatitis C screening test       Relevant Orders   Hepatitis C antibody   Screening for cervical cancer       Relevant Orders   Cytology - PAP   Screening, lipid       Relevant Orders   Lipid Panel With LDL/HDL Ratio   Screening for diabetes mellitus       Relevant Orders   Hgb A1c w/o eAG   Late period       Relevant Orders   POCT urine pregnancy (Completed)   Lesion of oral mucosa       Relevant Orders   Herpes simplex virus culture       1) 4) Gardasil Series  discussed and if applicable offered to patient - Patient is uncertain if she has previously completed 3 shot series   2) STI screening  was offered and accepted  3)  ASCCP guidelines and rationale discussed.  Patient opts for every 3 years screening interval. First PAP today  4) Contraception - the patient is currently using  abstinence currently.  She is happy with her current form of contraception and plans to continue We discussed safe sex practices to reduce her furture risk of STI's.    5) Return in about 1 year (around 03/30/2024) for annual established gyn.   Tresea Mall, CNM [facility] 03/31/2023 10:45 AM

## 2023-04-01 LAB — CBC WITH DIFFERENTIAL/PLATELET
Basophils Absolute: 0 10*3/uL (ref 0.0–0.2)
Basos: 1 %
EOS (ABSOLUTE): 0.1 10*3/uL (ref 0.0–0.4)
Eos: 2 %
Hematocrit: 40.5 % (ref 34.0–46.6)
Hemoglobin: 13.6 g/dL (ref 11.1–15.9)
Immature Grans (Abs): 0 10*3/uL (ref 0.0–0.1)
Immature Granulocytes: 0 %
Lymphocytes Absolute: 1.9 10*3/uL (ref 0.7–3.1)
Lymphs: 40 %
MCH: 32 pg (ref 26.6–33.0)
MCHC: 33.6 g/dL (ref 31.5–35.7)
MCV: 95 fL (ref 79–97)
Monocytes Absolute: 0.3 10*3/uL (ref 0.1–0.9)
Monocytes: 7 %
Neutrophils Absolute: 2.3 10*3/uL (ref 1.4–7.0)
Neutrophils: 50 %
Platelets: 341 10*3/uL (ref 150–450)
RBC: 4.25 x10E6/uL (ref 3.77–5.28)
RDW: 12.2 % (ref 11.7–15.4)
WBC: 4.6 10*3/uL (ref 3.4–10.8)

## 2023-04-01 LAB — COMPREHENSIVE METABOLIC PANEL
ALT: 7 IU/L (ref 0–32)
ALT: 9 IU/L (ref 0–32)
AST: 13 IU/L (ref 0–40)
AST: 13 IU/L (ref 0–40)
Albumin: 4.4 g/dL (ref 4.0–5.0)
Albumin: 4.5 g/dL (ref 4.0–5.0)
Alkaline Phosphatase: 54 IU/L (ref 44–121)
Alkaline Phosphatase: 55 IU/L (ref 44–121)
BUN/Creatinine Ratio: 11 (ref 9–23)
BUN/Creatinine Ratio: 11 (ref 9–23)
BUN: 7 mg/dL (ref 6–20)
BUN: 7 mg/dL (ref 6–20)
Bilirubin Total: 0.4 mg/dL (ref 0.0–1.2)
Bilirubin Total: 0.5 mg/dL (ref 0.0–1.2)
CO2: 24 mmol/L (ref 20–29)
CO2: 24 mmol/L (ref 20–29)
Calcium: 9.5 mg/dL (ref 8.7–10.2)
Calcium: 9.6 mg/dL (ref 8.7–10.2)
Chloride: 102 mmol/L (ref 96–106)
Chloride: 102 mmol/L (ref 96–106)
Creatinine, Ser: 0.62 mg/dL (ref 0.57–1.00)
Creatinine, Ser: 0.66 mg/dL (ref 0.57–1.00)
Globulin, Total: 2.3 g/dL (ref 1.5–4.5)
Globulin, Total: 2.2 g/dL (ref 1.5–4.5)
Glucose: 86 mg/dL (ref 70–99)
Glucose: 87 mg/dL (ref 70–99)
Potassium: 4.2 mmol/L (ref 3.5–5.2)
Potassium: 4.2 mmol/L (ref 3.5–5.2)
Sodium: 139 mmol/L (ref 134–144)
Sodium: 141 mmol/L (ref 134–144)
Total Protein: 6.7 g/dL (ref 6.0–8.5)
Total Protein: 6.7 g/dL (ref 6.0–8.5)
eGFR: 130 mL/min/{1.73_m2} (ref >59)
eGFR: 128 mL/min/{1.73_m2} (ref >59)

## 2023-04-01 LAB — RPR QUALITATIVE
RPR Ser Ql: NONREACTIVE
RPR Ser Ql: NONREACTIVE

## 2023-04-01 LAB — LIPID PANEL WITH LDL/HDL RATIO
Cholesterol, Total: 152 mg/dL (ref 100–199)
Cholesterol, Total: 149 mg/dL (ref 100–199)
HDL: 72 mg/dL (ref >39)
HDL: 73 mg/dL (ref >39)
LDL Chol Calc (NIH): 64 mg/dL (ref 0–99)
LDL Chol Calc (NIH): 61 mg/dL (ref 0–99)
LDL/HDL Ratio: 0.9 ratio (ref 0.0–3.2)
LDL/HDL Ratio: 0.8 ratio (ref 0.0–3.2)
Triglycerides: 84 mg/dL (ref 0–149)
Triglycerides: 81 mg/dL (ref 0–149)
VLDL Cholesterol Cal: 16 mg/dL (ref 5–40)
VLDL Cholesterol Cal: 15 mg/dL (ref 5–40)

## 2023-04-01 LAB — HEPATITIS C ANTIBODY
Hep C Virus Ab: NONREACTIVE
Hep C Virus Ab: NONREACTIVE

## 2023-04-01 LAB — HIV ANTIBODY (ROUTINE TESTING W REFLEX)
HIV Screen 4th Generation wRfx: NONREACTIVE
HIV Screen 4th Generation wRfx: NONREACTIVE

## 2023-04-01 LAB — HGB A1C W/O EAG: Hgb A1c MFr Bld: 5.2 % (ref 4.8–5.6)

## 2023-04-01 LAB — TSH
TSH: 2.05 u[IU]/mL (ref 0.450–4.500)
TSH: 2.04 u[IU]/mL (ref 0.450–4.500)

## 2023-04-01 LAB — HEPATITIS B SURFACE ANTIBODY,QUALITATIVE
Hep B Surface Ab, Qual: NONREACTIVE
Hep B Surface Ab, Qual: NONREACTIVE

## 2023-04-03 LAB — HERPES SIMPLEX VIRUS CULTURE

## 2023-04-07 LAB — CYTOLOGY - PAP
Chlamydia: NEGATIVE
Comment: NEGATIVE
Comment: NEGATIVE
Comment: NEGATIVE
Comment: NORMAL
Diagnosis: UNDETERMINED — AB
High risk HPV: NEGATIVE
Neisseria Gonorrhea: NEGATIVE
Trichomonas: NEGATIVE

## 2023-04-11 ENCOUNTER — Encounter: Payer: Self-pay | Admitting: Advanced Practice Midwife

## 2024-03-31 ENCOUNTER — Ambulatory Visit: Admitting: Advanced Practice Midwife

## 2024-03-31 ENCOUNTER — Encounter: Payer: Self-pay | Admitting: Advanced Practice Midwife

## 2024-03-31 ENCOUNTER — Other Ambulatory Visit (HOSPITAL_COMMUNITY)
Admission: RE | Admit: 2024-03-31 | Discharge: 2024-03-31 | Disposition: A | Discharge status: Home / Self Care | Source: Ambulatory Visit | Attending: Advanced Practice Midwife | Admitting: Advanced Practice Midwife

## 2024-03-31 VITALS — BP 97/62 | HR 75 | Ht 62.0 in | Wt 125.1 lb

## 2024-03-31 DIAGNOSIS — Z8742 Personal history of other diseases of the female genital tract: Secondary | ICD-10-CM | POA: Diagnosis present

## 2024-03-31 DIAGNOSIS — Z01419 Encounter for gynecological examination (general) (routine) without abnormal findings: Secondary | ICD-10-CM

## 2024-03-31 DIAGNOSIS — Z113 Encounter for screening for infections with a predominantly sexual mode of transmission: Secondary | ICD-10-CM | POA: Insufficient documentation

## 2024-03-31 DIAGNOSIS — Z1272 Encounter for screening for malignant neoplasm of vagina: Secondary | ICD-10-CM | POA: Diagnosis not present

## 2024-03-31 DIAGNOSIS — Z124 Encounter for screening for malignant neoplasm of cervix: Secondary | ICD-10-CM | POA: Insufficient documentation

## 2024-03-31 NOTE — Progress Notes (Signed)
 [facility]  Gynecology Annual Exam  PCP: Pcp, No  Chief Complaint:  Chief Complaint  Patient presents with   Annual Exam    History of Present Illness: Patient is a 23 y.o. G0P0000 presents for annual exam. The patient has no gyn complaints today. She has questions about possible infertility. Past history includes no pregnancy despite frequent unprotected intercourse. She reports daily unprotected intercourse between the ages of [age] and Chlamydia infection treated multiple times at age [age]. We discussed the possibility of PID although she denies any symptoms in the past and no current symptoms. She did use Nexplanon  for 6 months in 2023. Reviewed natural family planning method, and follow up as needed with MD regarding fertility questions.  LMP: Patient's last menstrual period was 03/30/2024 (exact date). Menarche:16 Average Interval: regular, 28 days, occasionally skips a month Duration of flow: 7 days Heavy Menses: first few days Clots: no Intermenstrual Bleeding: no Postcoital Bleeding: no Dysmenorrhea: no  The patient is sexually active. She currently uses none for contraception. She denies dyspareunia.  The patient does not perform self breast exams.  There is no notable family history of breast or ovarian cancer in her family.  The patient wears seatbelts: yes.  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    The patient denies current symptoms of depression.    Review of Systems: Review of Systems  Constitutional:  Negative for chills and fever.  HENT:  Negative for congestion, ear discharge, ear pain, hearing loss, sinus pain and sore throat.   Eyes:  Negative for blurred vision and double vision.  Respiratory:  Negative for cough, shortness of breath and wheezing.   Cardiovascular:  Negative for chest pain, palpitations and leg swelling.  Gastrointestinal:  Negative for  abdominal pain, blood in stool, constipation, diarrhea, heartburn, melena, nausea and vomiting.  Genitourinary:  Negative for dysuria, flank pain, frequency, hematuria and urgency.  Musculoskeletal:  Negative for back pain, joint pain and myalgias.  Skin:  Negative for itching and rash.  Neurological:  Negative for dizziness, tingling, tremors, sensory change, speech change, focal weakness, seizures, loss of consciousness, weakness and headaches.  Endo/Heme/Allergies:  Negative for environmental allergies. Does not bruise/bleed easily.  Psychiatric/Behavioral:  Negative for depression, hallucinations, memory loss, substance abuse and suicidal ideas. The patient is not nervous/anxious and does not have insomnia.     Past Medical History:  Patient Active Problem List   Diagnosis Date Noted   Contraception management 05/15/2022   Encounter for removal of subdermal contraceptive implant 05/15/2022    Removed on 05-15-22    Constipation 02/28/2020   Ingrown toenail 02/28/2020   Nocturnal enuresis 02/28/2020    Past Surgical History:  Past Surgical History:  Procedure Laterality Date   WISDOM TOOTH EXTRACTION      Gynecologic History:  Patient's last menstrual period was 03/30/2024 (exact date). Contraception: none Last Pap: 03/31/2023 Results were:  ASCUS with NEGATIVE high risk HPV   Obstetric History: G0P0000  Family History:  Family History  Problem Relation Age of Onset   Diabetes Maternal Aunt    Diabetes Maternal Grandmother     Social History:  Social History   Socioeconomic History   Marital status: Single    Spouse name: Not on file   Number of children: Not on file   Years of education: Not on file   Highest education level: Not on file  Occupational History  Not on file  Tobacco Use   Smoking status: Former    Types: Cigarettes   Smokeless tobacco: Never  Vaping Use   Vaping status: Every Day  Substance and Sexual Activity   Alcohol use: Not Currently    Drug use: Not Currently    Types: Marijuana   Sexual activity: Yes    Birth control/protection: None  Other Topics Concern   Not on file  Social History Narrative   Not on file   Social Drivers of Health   Financial Resource Strain: Not on file  Food Insecurity: Not on file  Transportation Needs: Not on file  Physical Activity: Not on file  Stress: Not on file  Social Connections: Not on file  Intimate Partner Violence: Not on file    Allergies:  No Known Allergies  Medications: Prior to Admission medications   Not on File    Physical Exam Vitals: Blood pressure 97/62, pulse 75, height 5' 2 (1.575 m), weight 125 lb 1.6 oz (56.7 kg), last menstrual period 03/30/2024.  General: NAD HEENT: normocephalic, anicteric Thyroid : no enlargement, no palpable nodules Pulmonary: No increased work of breathing, CTAB Cardiovascular: RRR, distal pulses 2+ Breast: Breast symmetrical, no tenderness, no palpable nodules or masses, no skin or nipple retraction present, no nipple discharge.  No axillary or supraclavicular lymphadenopathy. Abdomen: NABS, soft, non-tender, non-distended.  Umbilicus without lesions.  No hepatomegaly, splenomegaly or masses palpable. No evidence of hernia  Genitourinary:  External: Normal external female genitalia.  Normal urethral meatus, normal Bartholin's and Skene's glands.    Vagina: Normal vaginal mucosa, no evidence of prolapse.    Cervix: Grossly normal in appearance, no bleeding  Uterus: Non-enlarged, mobile, normal contour.  No CMT  Adnexa: ovaries non-enlarged, no adnexal masses  Rectal: deferred  Lymphatic: no evidence of inguinal lymphadenopathy Extremities: no edema, erythema, or tenderness Neurologic: Grossly intact Psychiatric: mood appropriate, affect full   Assessment: 23 y.o. G0P0000 routine annual exam  Plan: Problem List Items Addressed This Visit   None Visit Diagnoses       Well woman exam with routine gynecological exam    -   Primary   Relevant Orders   Cytology - PAP     History of abnormal cervical Pap smear       Relevant Orders   Cytology - PAP     Screening for cervical cancer       Relevant Orders   Cytology - PAP     Screen for sexually transmitted diseases       Relevant Orders   Cytology - PAP       1) 4) Gardasil Series discussed and if applicable offered to patient - Patient is uncertain if she has completed 3 shot series   2) STI screening  was offered and accepted  3)  ASCCP guidelines and rationale discussed.  Patient opts for every 3 years screening interval following normal PAP. Follow up PAP today.  4) Contraception - the patient is currently using  none.  She is attempting to conceive in the near future We discussed safe sex practices to reduce her furture risk of STI's.    5) Return in about 1 year (around 03/31/2025) for annual established gyn.   Slater Rains, CNM [facility] 03/31/2024 1:42 PM

## 2024-03-31 NOTE — Patient Instructions (Signed)
 Natural Family Planning: What to Know Natural family planning (NFP) is a type of birth control that doesn't use medicine or devices to prevent pregnancy. NFP can decrease the risk of pregnancy, although other birth control methods are more effective at preventing pregnancy. NFP doesn't protect against sexually transmitted infections (STIs). With NFP, you'll need to track the days each month when you ovulate. Ovulation is when your ovary releases an egg. Ovulation is the time of the menstrual cycle when you're most likely to get pregnant. Not having sex during ovulation lowers the chance of pregnancy. How does NFP work? You can use NFP to track days between periods and guess (predict) the release of an egg. You can use cycle patterns to decide when to have sex or not have sex. Many people have a period every 28-30 days, but cycles vary. Cycles can range between 23-35 days. A short or long cycle will affect the period of time that you can get pregnant. Ovulation happens 12-14 days before the start of the next period. The days you're most likely to get pregnant (fertile days) will vary based on how many days are between periods. A person with a 28-day menstrual cycle has about 6 days a month when they're the most fertile. An egg is fertile for 24 hours after it leaves the ovary. Sperm can live for 3 days or more. If you decide that you want to get pregnant, NFP can help you know when pregnancy is most likely to happen. What NFP methods can be used to prevent pregnancy? The basal body temperature method  During ovulation, a slight increase in body temperature is common. To use this method: Take and record your temperature every morning before getting out of bed. You may record temperatures anywhere you can best access and view patterns of change. You may track: On a piece of paper. On your calendar. On a chart. In an app on your phone. Do not have sex from the day the menstrual period starts until 3  days after the increase in temperature. If your temperature has already gotten higher, ovulation has already happened. You're more likely to get pregnant 2 to 3 days before the charted cycles of body temperature increase. Things that raise your body temperature and make it hard to guess when you'll ovulate: Having a fever. Not sleeping well. Not drinking enough fluid. Certain work schedules.  The cervical mucus method Right before ovulation, mucus from the cervix changes from dry and sticky to wet and slippery. The cervix is the lowest part of the uterus. To use this method: Check the mucus every day to look for these changes. Ovulation happens on the last day of wet, slippery mucus. Do not have sex starting when you first see wet, slippery mucus until 4 days after it stops or returns to normal. With this method, it's safe to have sex: After the 4 days have passed, and until 10 days after the menstrual period starts. On days when mucus is dry. Cervical mucus can increase or change due to reasons other than ovulation, such as infection, lubricants, some medicines, and being aroused. The symptothermal method This method combines the basal body temperature and the cervical mucus method and is more accurate. The calendar method This method involves tracking menstrual cycles to find out when ovulation happens. This method is helpful when cycle length varies. To use this method: For 6 months, write down when periods start and end and the length of each menstrual cycle. The cycle length  is from day 1 of the current menstrual period to day 1 of the next period. Use this information to find when you'll likely ovulate. Avoid sex during that time. You may need help from your health care provider to guess the days you're most likely to get pregnant. Ovulation usually happens 12-14 days before the start of the next menstrual period. Light vaginal bleeding, called spotting, or belly cramps during the  middle of a menstrual cycle may be signs of ovulation. However, some people don't have these symptoms.  The standard days method This method is based on studies of hormone levels during normal menstrual cycles. This method works best for people with cycles that are 26-32 days long. If your cycle is 26-32 days long, you're most likely to get pregnant between days 8 and 19. To prevent pregnancy, you should not have sex during this time, or you should use a barrier method of birth control. When should I not use the NFP method? You should not use NFP if: You have irregular menstrual periods, such as: You sometimes skip a period. You have bleeding from your vagina that isn't normal. You recently had a baby or are breastfeeding. You have an infection of your vagina or cervix. You take medicines that can affect the mucus from your vagina or body temperature, such as: Antibiotics. Thyroid medicines. Antihistamines that are in some cold and allergy medicines. You do not want to become pregnant at this time. Other methods of birth control are more effective at preventing pregnancy. You're worried about STIs. This information is not intended to replace advice given to you by your health care provider. Make sure you discuss any questions you have with your health care provider. Document Revised: 06/06/2023 Document Reviewed: 06/06/2023 Elsevier Patient Education  2024 [employer].

## 2024-04-02 ENCOUNTER — Ambulatory Visit: Payer: Self-pay | Admitting: Advanced Practice Midwife

## 2024-04-02 LAB — CYTOLOGY - PAP
Chlamydia: NEGATIVE
Comment: NEGATIVE
Comment: NEGATIVE
Comment: NORMAL
Diagnosis: NEGATIVE
Neisseria Gonorrhea: NEGATIVE
Trichomonas: NEGATIVE

## 2024-06-11 ENCOUNTER — Ambulatory Visit (INDEPENDENT_AMBULATORY_CARE_PROVIDER_SITE_OTHER)

## 2024-06-11 VITALS — BP 98/63 | HR 85 | Ht 62.0 in | Wt 137.0 lb

## 2024-06-11 DIAGNOSIS — Z3201 Encounter for pregnancy test, result positive: Secondary | ICD-10-CM

## 2024-06-11 DIAGNOSIS — Z3200 Encounter for pregnancy test, result unknown: Secondary | ICD-10-CM

## 2024-06-11 LAB — POCT URINE PREGNANCY: Preg Test, Ur: POSITIVE — AB

## 2024-06-11 NOTE — Progress Notes (Signed)
    NURSE VISIT NOTE  Subjective:    Patient ID: Felicia [name], female    DOB: [birth year], 23 y.o.   MRN: [id number]  HPI  Patient is a 23 y.o. G6P0000 female who presents for evaluation of amenorrhea. She believes she could be pregnant. Patient is ambivalent about pregnancy. Sexual Activity: single partner, contraception: none. Current symptoms also include: breast tenderness, fatigue, frequent urination, nausea, and positive home pregnancy test. Last period was normal. Pt showed me a picture of some light pink brown blood on toilet paper. She did say she had recently had intercourse. I advised her this could be normal after intercourse. She would want to look for heavy bleeding like a period and let us  know if that happened.     Objective:    BP 98/63   Pulse 85   Ht 5' 2 (1.575 m)   Wt 137 lb (62.1 kg)   LMP 05/01/2024   BMI 25.06 kg/m   Lab Review  Results for orders placed or performed in visit on 06/11/24  POCT urine pregnancy  Result Value Ref Range   Preg Test, Ur Positive (A) Negative    Assessment:   1. Possible pregnancy     Plan:   Pregnancy Test: Positive  Estimated Date of Delivery: 02/05/25 BP Cuff Measurement taken. Cuff Size Adult Small Encouraged well-balanced diet, plenty of rest when needed, pre-natal vitamins daily and walking for exercise.  Discussed self-help for nausea, avoiding OTC medications until consulting provider or pharmacist, other than Tylenol  as needed, minimal caffeine (1-2 cups daily) and avoiding alcohol.   She will schedule her nurse visit @ 7-[redacted] wks pregnant, u/s for dating @10  wk, and NOB visit at [redacted] wk pregnant.    Feel free to call with any questions.     Harlene Gander, CMA

## 2024-06-11 NOTE — Patient Instructions (Signed)
 Bleeding During Your First Trimester: What to Know A small amount of bleeding from the vagina is common during early pregnancy. This kind of bleeding is also called spotting. Sometimes the bleeding is normal and does not cause problems. At other times, though, bleeding may be a sign of something serious. Normal bleeding in pregnancy can happen: When the fertilized egg attaches itself to your uterus. When blood vessels change because of the pregnancy. When you have pelvic exams. When you have sex. Abnormal bleeding can happen: When you have an infection. When you have growths in your womb. If you're having a miscarriage or at risk of having one. If you have other problems in your pregnancy. Tell your doctor right away about any bleeding from your vagina. Follow these instructions at home: Watch your bleeding  Try to know what causes your bleeding. Ask yourself these questions: Does the bleeding start on its own? Does the bleeding start after something is done, such as sex or a pelvic exam? Write the things you see about your bleeding. For example: Does the bleeding flow freely without stopping? Or, does it start and stop, and then start again? Is the bleeding heavy or light? How many pads do you use in a day? How much blood is in them? Tell your doctor if your bleeding becomes worse, or if you pass tissue. They may want to see the tissue. Activity Do not have sex until your doctor says that it's safe. Ask for help with everyday activities. Ask what things are safe for you to do at home. Ask when you can go back to work or school. Medicines Take your medicines only as told. Do not take aspirin unless your doctor says you can. Aspirin can cause bleeding. General instructions Do not use tampons. Do not douche. Keep all follow-up visits. Contact a doctor if: You have vaginal bleeding at any time while you are pregnant. You have cramps. You have a fever or chills. Get help right away  if: You have very bad cramps in your back or belly. Your bleeding gets worse. You pass large clots or a large amount of tissue. You feel light-headed. You feel weak. You faint. You are leaking fluid from your vagina. You have a gush of fluid from your vagina. This information is not intended to replace advice given to you by your health care provider. Make sure you discuss any questions you have with your health care provider. Document Revised: 09/12/2023 Document Reviewed: 09/12/2023 Elsevier Patient Education  2025 [employer].

## 2024-06-21 ENCOUNTER — Telehealth (INDEPENDENT_AMBULATORY_CARE_PROVIDER_SITE_OTHER)

## 2024-06-21 DIAGNOSIS — Z3401 Encounter for supervision of normal first pregnancy, first trimester: Secondary | ICD-10-CM

## 2024-06-21 DIAGNOSIS — Z3689 Encounter for other specified antenatal screening: Secondary | ICD-10-CM

## 2024-06-21 DIAGNOSIS — Z3400 Encounter for supervision of normal first pregnancy, unspecified trimester: Secondary | ICD-10-CM | POA: Insufficient documentation

## 2024-06-21 NOTE — Patient Instructions (Signed)
 For nausea you can take vitamin B6 three times a day and take one Unisom before bed.  You can buy both of these medication at the pharmacy - ask the pharmacist and they should be able to point you in the right direction!  Unfortunately, I don't see the information about pregnancy safe medications in our virtual instructions so I am unable to send them to you through MyChart.  We will give you a list when you come to your first appointment.  Until then, please call and ask if there is something you are unsure about.

## 2024-06-21 NOTE — Progress Notes (Signed)
 New OB Intake  I connected with  Felicia [name] on 06/21/24 at  8:15 AM EDT by MyChart Video Visit and verified that I am speaking with the correct person using two identifiers. Nurse is located at [facility] and pt is located at home.  I discussed the limitations, risks, security and privacy concerns of performing an evaluation and management service by telephone and the availability of in person appointments. I also discussed with the patient that there may be a patient responsible charge related to this service. The patient expressed understanding and agreed to proceed.  I explained I am completing New OB Intake today. We discussed her EDD of 02/05/2025 that is based on LMP of 05/01/2024. Pt is G1/P0. I reviewed her allergies, medications, Medical/Surgical/OB history, and appropriate screenings. There are cats in the home: no. . Based on history, this is a/an pregnancy uncomplicated .   Patient Active Problem List   Diagnosis Date Noted   Supervision of normal first pregnancy 06/21/2024   Contraception management 05/15/2022   Encounter for removal of subdermal contraceptive implant 05/15/2022   Constipation 02/28/2020   Ingrown toenail 02/28/2020   Nocturnal enuresis 02/28/2020    Concerns addressed today:  Nausea - patient advised to take vitamin B6 and Unisom.  If no improvement, call clinic and we can try a prescription medication.  Delivery Plans:  Patient is unsure where she would like to deliver.  She was advised if not delivering at [facility] we will transfer her care to an appropriate ob provider.  Anatomy US  Explained first scheduled US  will be 07/02/2024. Anatomy US  will be scheduled around [redacted] weeks gestational age.  Labs Discussed genetic screening with patient. Patient desires genetic testing to be drawn at new OB visit. Discussed possible labs to be drawn at new OB appointment.  COVID Vaccine Patient has not had COVID vaccine.   Social Determinants of Health Food  Insecurity: denies food insecurity WIC Referral: Patient is interested in referral to [facility].  Transportation: Patient denies transportation needs. Childcare: Discussed no children allowed at ultrasound appointments.   First visit review I reviewed new OB appt with pt. I explained she will have blood work and pap smear/pelvic exam if indicated. Explained pt will be seen by Jinnie Cookey CNM at first visit; encounter routed to appropriate provider.   Rollo FORBES Louder, RN 06/21/2024  8:45 AM

## 2024-07-02 ENCOUNTER — Encounter: Payer: Self-pay | Admitting: Licensed Practical Nurse

## 2024-07-02 ENCOUNTER — Ambulatory Visit (INDEPENDENT_AMBULATORY_CARE_PROVIDER_SITE_OTHER): Admitting: Licensed Practical Nurse

## 2024-07-02 ENCOUNTER — Ambulatory Visit (INDEPENDENT_AMBULATORY_CARE_PROVIDER_SITE_OTHER)

## 2024-07-02 ENCOUNTER — Other Ambulatory Visit: Payer: Self-pay | Admitting: Licensed Practical Nurse

## 2024-07-02 VITALS — BP 109/76 | HR 93 | Resp 16 | Ht 62.0 in | Wt 131.4 lb

## 2024-07-02 DIAGNOSIS — O3680X0 Pregnancy with inconclusive fetal viability, not applicable or unspecified: Secondary | ICD-10-CM | POA: Diagnosis not present

## 2024-07-02 DIAGNOSIS — Z3A01 Less than 8 weeks gestation of pregnancy: Secondary | ICD-10-CM | POA: Diagnosis not present

## 2024-07-02 DIAGNOSIS — O021 Missed abortion: Secondary | ICD-10-CM

## 2024-07-02 DIAGNOSIS — Z3200 Encounter for pregnancy test, result unknown: Secondary | ICD-10-CM

## 2024-07-02 NOTE — Progress Notes (Signed)
 Pcp, No   Chief Complaint  Patient presents with   Threatened Miscarriage    HPI:      Felicia [name] is a 23 y.o. G1P0000 whose LMP was Patient's last menstrual period was 05/01/2024., presents today to discuss US  results  Felicia [name] is here with her partner. She just had her viability US  that showed a non viable pregnancy. Felicia [name] is appropriately tearful. Reports she has has had some light spotting, denies cramping. Is unsure of her blood type.  Discussed management options including risks/benefits 1) watchful waiting for up to 2 weeks 2) Cytotec 3) D and C.  Felicia [name] desires a D and C.      Patient Active Problem List   Diagnosis Date Noted   Supervision of normal first pregnancy 06/21/2024   Contraception management 05/15/2022   Encounter for removal of subdermal contraceptive implant 05/15/2022   Constipation 02/28/2020   Ingrown toenail 02/28/2020   Nocturnal enuresis 02/28/2020    Past Surgical History:  Procedure Laterality Date   WISDOM TOOTH EXTRACTION      Family History  Problem Relation Age of Onset   Diabetes Maternal Aunt    Diabetes Maternal Grandmother     Social History   Socioeconomic History   Marital status: Significant Other    Spouse name: Felicia [name]   Number of children: Not on file   Years of education: 13   Highest education level: Some college, no degree  Occupational History   Not on file  Tobacco Use   Smoking status: Former    Current packs/day: 0.00    Types: Cigarettes    Quit date: 02/05/2023    Years since quitting: 1.4   Smokeless tobacco: Never  Vaping Use   Vaping status: Former  Substance and Sexual Activity   Alcohol use: Not Currently   Drug use: Not Currently   Sexual activity: Yes    Birth control/protection: None  Other Topics Concern   Not on file  Social History Narrative   Not on file   Social Drivers of Health   Financial Resource Strain: Not on file  Food Insecurity: No Food Insecurity (06/21/2024)   Hunger  Vital Sign    Worried About Running Out of Food in the Last Year: Never true    Ran Out of Food in the Last Year: Never true  Transportation Needs: No Transportation Needs (06/21/2024)   PRAPARE - Administrator, Civil Service (Medical): No    Lack of Transportation (Non-Medical): No  Physical Activity: Not on file  Stress: Not on file  Social Connections: Not on file  Intimate Partner Violence: Not At Risk (06/21/2024)   Humiliation, Afraid, Rape, and Kick questionnaire    Fear of Current or Ex-Partner: No    Emotionally Abused: No    Physically Abused: No    Sexually Abused: No    Outpatient Medications Prior to Visit  Medication Sig Dispense Refill   Prenatal Vit-Fe Fumarate-FA (MULTIVITAMIN-PRENATAL) 27-0.8 MG TABS tablet Take 1 tablet by mouth daily at 12 noon.     No facility-administered medications prior to visit.      ROS:  Review of Systems see HPI    OBJECTIVE:   Vitals:  BP 109/76   Pulse 93   Resp 16   Ht 5' 2 (1.575 m)   Wt 131 lb 6.4 oz (59.6 kg)   LMP 05/01/2024   BMI 24.03 kg/m   Physical Exam Constitutional:      Appearance: Normal appearance.  Comments: Appropriately tearful   Pulmonary:     Effort: Pulmonary effort is normal.  Genitourinary:    Comments: Exam not necessary for purpose of visit  Neurological:     Mental Status: She is alert.  Psychiatric:        Mood and Affect: Mood normal.        Thought Content: Thought content normal.    FIRST TRIMESTER ULTRASOUND REPORT     Patient Name: Felicia [name] DOB: September 20, 2001 MRN: [id number]     Location: [facility] Date of Service: 07/02/2024  Ordering Provider: Jinnie Cookey, CNM   Indications:dating Findings:  Felicia [name] intrauterine pregnancy is visualized with a CRL consistent with [redacted]w[redacted]d gestation, giving an (U/S) EDD of 02/20/25. The (U/S) EDD is not consistent with the clinically established EDD of 02/05/25 based on LMP.   FHR: No heart tones were  detected. CRL measurement: 7 mm Yolk sac is visualized and appears abnormal. Amnion: visualized and appears normal    Right Ovary is normal in appearance. Left Ovary is normal appearance. Corpus luteal cyst:  is not visualized Survey of the adnexa demonstrates no adnexal masses. There is no free peritoneal fluid in the cul de sac.   Impression: 1. [redacted]w[redacted]d  Non-Viable Singleton Intrauterine pregnancy by U/S. 2. Recommend EDD of 02/20/25 based on U/S.   Recommendations: 1.Clinical correlation with the patient's History and Physical Exam. 2. Results were given to provider CNM, Jinnie Cookey.    Results: No results found for this or any previous visit (from the past 24 hours).   Assessment/Plan: 1. Missed abortion (Primary) - ABO AND RH  - Beta HCG, Quant   -All questions answered, anticipatory guidance incase of spontaneous miscarriage, bleeding precautions given. Pt to see MD ASAP to arrange D and C    No orders of the defined types were placed in this encounter.    JINNIE HERO [facility], CNM 07/02/2024 4:52 PM

## 2024-07-03 LAB — ABO AND RH: Rh Factor: POSITIVE

## 2024-07-03 LAB — BETA HCG QUANT (REF LAB): hCG Quant: 48127 m[IU]/mL

## 2024-07-06 ENCOUNTER — Ambulatory Visit (INDEPENDENT_AMBULATORY_CARE_PROVIDER_SITE_OTHER): Admitting: Obstetrics & Gynecology

## 2024-07-06 ENCOUNTER — Encounter: Payer: Self-pay | Admitting: Obstetrics & Gynecology

## 2024-07-06 VITALS — BP 103/70 | HR 78 | Ht 62.0 in | Wt 131.8 lb

## 2024-07-06 DIAGNOSIS — Z3A01 Less than 8 weeks gestation of pregnancy: Secondary | ICD-10-CM | POA: Diagnosis not present

## 2024-07-06 DIAGNOSIS — O021 Missed abortion: Secondary | ICD-10-CM | POA: Diagnosis not present

## 2024-07-06 MED ORDER — MISOPROSTOL 200 MCG PO TABS: ORAL_TABLET | ORAL | 0 refills | Status: DC

## 2024-07-06 MED ORDER — IBUPROFEN 800 MG PO TABS: 800.0000 mg | ORAL_TABLET | Freq: Three times a day (TID) | ORAL | 1 refills | Status: AC | PRN

## 2024-07-06 NOTE — Progress Notes (Signed)
    GYNECOLOGY PROGRESS NOTE  Subjective:    Patient ID: Felicia [name], female    DOB: 12-18-2000, 23 y.o.   MRN: [id number]  HPI  Patient is a 23 y.o. G1P0000 here today to discuss her missed ab at 6 weeks. She is having some brownish discharge for a couple of weeks. She is having some discomfort for 2 days.   She plans to remain abstinent for  a couple of years.   The following portions of the patient's history were reviewed and updated as appropriate: allergies, current medications, past family history, past medical history, past social history, past surgical history, and problem list.  Review of Systems Pertinent items are noted in HPI.   Objective:   Last menstrual period 05/01/2024. There is no height or weight on file to calculate BMI. Well nourished, well hydrated Latina, no apparent distress  She is here with her partner O+ blood type   Assessment:   1. Missed abortion      Plan:   1. Missed abortion (Primary) We discussed all the options of watchful waiting, cytotec, and d&c. After hearing all the risks/benefits, she opts for cytotec.  Rec follow QBHCG until 0. Rec check QBHCG starting 1 week after cytotec.

## 2024-07-16 ENCOUNTER — Encounter: Admitting: Licensed Practical Nurse

## 2024-07-26 ENCOUNTER — Other Ambulatory Visit: Payer: Self-pay | Admitting: Licensed Practical Nurse

## 2024-07-26 DIAGNOSIS — O021 Missed abortion: Secondary | ICD-10-CM

## 2024-07-26 NOTE — Addendum Note (Signed)
 Addended by: Shaeley Segall on: 07/26/2024 02:03 PM   Modules accepted: Orders

## 2024-07-29 ENCOUNTER — Encounter: Payer: Self-pay | Admitting: Advanced Practice Midwife

## 2024-07-29 ENCOUNTER — Ambulatory Visit (INDEPENDENT_AMBULATORY_CARE_PROVIDER_SITE_OTHER): Admitting: Advanced Practice Midwife

## 2024-07-29 VITALS — BP 101/69 | HR 82 | Wt 132.8 lb

## 2024-07-29 DIAGNOSIS — O021 Missed abortion: Secondary | ICD-10-CM | POA: Diagnosis not present

## 2024-07-29 DIAGNOSIS — Z09 Encounter for follow-up examination after completed treatment for conditions other than malignant neoplasm: Secondary | ICD-10-CM | POA: Diagnosis not present

## 2024-07-29 DIAGNOSIS — O039 Complete or unspecified spontaneous abortion without complication: Secondary | ICD-10-CM

## 2024-07-29 NOTE — Patient Instructions (Signed)
 Miscarriage A miscarriage is the loss of an unborn baby before the 20th week of pregnancy. Most miscarriages occur in the first 3 months of pregnancy. A miscarriage may happen before a woman knows that they're pregnant. If you lose a pregnancy, talk with your health care provider about: Any questions you have about the loss of your baby. How to work through your grief. Plans for future pregnancy. What are the causes? Many times, the cause of this condition is not known. What increases the risk? Certain medical conditions Conditions that affect hormones, such as: Thyroid problems. Polycystic ovary syndrome. Diabetes. Autoimmune disorders. Infections. Bleeding problems. Obesity. Lifestyle factors Smoking, vaping, or use of other products with tobacco or nicotine in them. Being around people who smoke. Drinking alcohol or taking certain substances. Having large amounts of caffeine. Problems with the body Scars in the uterus. Growths, such as fibroids, in the uterus. Problems in the body that are present at birth. Infection. A cervix that opens and thins before your due date. Personal or medical history Having had a miscarriage before. Being younger than age 46 or older than age 51 when you become pregnant. Being around a harmful things, such as radiation. Having lead or other heavy metals where you live or work. Use of some medicines. What are the signs or symptoms? Symptoms of this condition include: Blood or spots of blood coming from the vagina. Pain or cramps in the belly or low back. Fluid or tissue coming out of the vagina. How is this diagnosed? This condition may be diagnosed based on: A physical exam. Ultrasound. Lab tests, such as blood tests or urine tests. How is this treated? Treatment is not needed if all the pregnancy tissue came out of your uterus. If you need treatment, you may be treated with: Dilation and curettage (D&C). This removes the remaining  tissue from the uterus. Medicines. These may be given to help your body remove the last of the tissue. Antibiotics. Follow these instructions at home: Medicines Take your medicines only as told. If you were given antibiotics, take them as told. Do not stop taking them even if you start to feel better. Activity Rest as told by your provider. Ask your provider what activities are safe for you. If able, have someone help with home and family duties during this time. General instructions  Watch how much tissue comes out of the vagina. Watch the size of any blood clots that come out of the vagina. Do not have sex or douche until your provider says it is okay. Do not put things, such as tampons, in your vagina until your provider says it is okay. To help you and your partner with grieving: Talk with your provider. See a Veterinary surgeon. When you are ready, talk with your provider about: Things to do for your health. How you can be healthy if you get pregnant again. Where to find more information [employer] of Obstetricians and Gynecologists: acog.org U.S. Department of Health and Cytogeneticist of Women's Health: http://hoffman.com/ Contact a health care provider if: You have a fever or chills. There's bad-smelling fluid coming from your vagina. You have more bleeding instead of less. More tissue or blood clots come out of your vagina than you were told to expect. You become light-headed or weak. Get help right away if: Heavy bleeding soaks through 2 large pads an hour for more than 2 hours. You faint. You feel sad all the time. You think about hurting yourself. These symptoms  may be an emergency. Take one of these steps right away: Go to your nearest emergency room. Call 911. Call the Suicide & Crisis Lifeline (free and confidential): Call (586)285-6092 or 988. Text 709-583-9992. This information is not intended to replace advice given to you by your health care  provider. Make sure you discuss any questions you have with your health care provider. Document Revised: 08/05/2023 Document Reviewed: 03/07/2023 Elsevier Patient Education  2024 [employer].

## 2024-07-29 NOTE — Progress Notes (Signed)
 Patient ID: Felicia [name], female   DOB: 2001-04-19, 23 y.o.   MRN: [id number]  Reason for Visit: Miscarriage    Subjective:  HPI:  Felicia [name] is a 23 y.o. female who presents in office for follow up of miscarriage. She reports taking cytotec on 10/9 with strong cramping/heavy bleeding and passing of pregnancy that night. Bleeding became moderate over the next week and then on the 15th became heavy again with quarter size clots. She is changing her pad every 3-4 hours. She is having episodes of sharp stabbing pain mid abdomin that she describes as moderate-severe pain. Patient denies nausea, vomiting or fever.  She admits constipation. She did have intercourse last week- unprotected/pull out. Denies vaginal itching, irritation, foul discharge. Advised avoid intercourse until bleeding stops.  Pelvic exam declined. Discussed likely normal amount of bleeding at this time. Will check Beta Hcg today. Precautions reviewed for severe symptoms.   Past Medical History:  Diagnosis Date   No pertinent past medical history    Family History  Problem Relation Age of Onset   Diabetes Maternal Aunt    Diabetes Maternal Grandmother    Past Surgical History:  Procedure Laterality Date   WISDOM TOOTH EXTRACTION      Short Social History:  Social History   Tobacco Use   Smoking status: Former    Current packs/day: 0.00    Types: Cigarettes    Quit date: 02/05/2023    Years since quitting: 1.4   Smokeless tobacco: Never  Substance Use Topics   Alcohol use: Not Currently    No Known Allergies  Current Outpatient Medications  Medication Sig Dispense Refill   ibuprofen (ADVIL) 800 MG tablet Take 1 tablet (800 mg total) by mouth every 8 (eight) hours as needed. 60 tablet 1   misoprostol (CYTOTEC) 200 MCG tablet Take 4 tablets at one time 4 tablet 0   Prenatal Vit-Fe Fumarate-FA (MULTIVITAMIN-PRENATAL) 27-0.8 MG TABS tablet Take 1 tablet by mouth daily at 12 noon.     No current  facility-administered medications for this visit.    Review of Systems  Constitutional:  Negative for chills and fever.  HENT:  Negative for congestion, ear discharge, ear pain, hearing loss, sinus pain and sore throat.   Eyes:  Negative for blurred vision and double vision.  Respiratory:  Negative for cough, shortness of breath and wheezing.   Cardiovascular:  Negative for chest pain, palpitations and leg swelling.  Gastrointestinal:  Positive for abdominal pain and constipation. Negative for blood in stool, diarrhea, heartburn, melena, nausea and vomiting.  Genitourinary:  Negative for dysuria, flank pain, frequency, hematuria and urgency.       Positive for vaginal bleeding  Musculoskeletal:  Negative for back pain, joint pain and myalgias.  Skin:  Negative for itching and rash.  Neurological:  Negative for dizziness, tingling, tremors, sensory change, speech change, focal weakness, seizures, loss of consciousness, weakness and headaches.  Endo/Heme/Allergies:  Negative for environmental allergies. Does not bruise/bleed easily.  Psychiatric/Behavioral:  Negative for depression, hallucinations, memory loss, substance abuse and suicidal ideas. The patient is not nervous/anxious and does not have insomnia.        Objective:  Objective   Vitals:   07/29/24 1056  BP: 101/69  Pulse: 82  Weight: 132 lb 12.8 oz (60.2 kg)   Body mass index is 24.29 kg/m. Constitutional: Well nourished, well developed female in no acute distress.  HEENT: normal Skin: Warm and dry.  Cardiovascular: Regular rate and rhythm.   Extremity:  no edema Respiratory: Clear to auscultation bilateral. Normal respiratory effort Abdomen: tender to palpation epigastric, non-tender over pelvis, no masses Psych: Alert and Oriented x3. No memory deficits. Normal mood and affect.    Assessment/Plan:     23 y.o. G1 P0 female follow up after miscarriage  Beta Hcg today Follow up until levels are 0-5 Precautions  reviewed   Slater Rains, CNM [facility] 07/29/2024 11:38 AM

## 2024-07-30 LAB — BETA HCG QUANT (REF LAB): hCG Quant: 183 m[IU]/mL

## 2024-08-03 ENCOUNTER — Ambulatory Visit: Payer: Self-pay | Admitting: Licensed Practical Nurse

## 2024-09-20 ENCOUNTER — Other Ambulatory Visit: Payer: Self-pay

## 2024-09-21 ENCOUNTER — Other Ambulatory Visit

## 2024-09-21 DIAGNOSIS — O039 Complete or unspecified spontaneous abortion without complication: Secondary | ICD-10-CM | POA: Diagnosis not present

## 2024-09-21 DIAGNOSIS — Z5189 Encounter for other specified aftercare: Secondary | ICD-10-CM | POA: Diagnosis not present

## 2024-09-22 LAB — BETA HCG QUANT (REF LAB): hCG Quant: 2 m[IU]/mL

## 2024-09-26 ENCOUNTER — Ambulatory Visit: Payer: Self-pay | Admitting: Advanced Practice Midwife
